# Patient Record
Sex: Male | Born: 1990 | Race: Black or African American | Hispanic: No | Marital: Single | Smoking: Never smoker
Health system: Southern US, Community
[De-identification: ages and names within clinical notes are randomized; demographics above are authoritative.]

## PROBLEM LIST (undated history)

## (undated) DIAGNOSIS — H33319 Horseshoe tear of retina without detachment, unspecified eye: Secondary | ICD-10-CM

---
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
## 2017-01-22 DIAGNOSIS — H5712 Ocular pain, left eye: Secondary | ICD-10-CM | POA: Diagnosis present

## 2017-01-22 DIAGNOSIS — H2000 Unspecified acute and subacute iridocyclitis: Secondary | ICD-10-CM | POA: Diagnosis not present

## 2017-01-23 ENCOUNTER — Emergency Department (HOSPITAL_COMMUNITY)
Admission: EM | Admit: 2017-01-23 | Discharge: 2017-01-23 | Disposition: A | Discharge status: Home / Self Care | Payer: PRIVATE HEALTH INSURANCE | Attending: Emergency Medicine | Admitting: Emergency Medicine

## 2017-01-23 ENCOUNTER — Encounter (HOSPITAL_COMMUNITY): Payer: Self-pay | Admitting: *Deleted

## 2017-01-23 DIAGNOSIS — H2000 Unspecified acute and subacute iridocyclitis: Secondary | ICD-10-CM

## 2017-01-23 HISTORY — DX: Horseshoe tear of retina without detachment, unspecified eye: H33.319

## 2017-01-23 MED ORDER — FLUORESCEIN SODIUM 0.6 MG OP STRP
1.0000 | ORAL_STRIP | Freq: Once | OPHTHALMIC | Status: AC
  Administered 2017-01-23: 1 via OPHTHALMIC

## 2017-01-23 MED ORDER — FLUORESCEIN SODIUM 0.6 MG OP STRP
ORAL_STRIP | OPHTHALMIC | Status: DC
Start: 2017-01-23 — End: 2017-01-23
  Filled 2017-01-23: qty 1

## 2017-01-23 MED ORDER — HOMATROPINE HBR 5 % OP SOLN
2.0000 [drp] | Freq: Once | OPHTHALMIC | Status: AC
Start: 2017-01-23 — End: 2017-01-23
  Administered 2017-01-23: 2 [drp] via OPHTHALMIC
  Filled 2017-01-23: qty 5

## 2017-01-23 MED ORDER — KETOROLAC TROMETHAMINE 0.5 % OP SOLN
1.0000 [drp] | Freq: Once | OPHTHALMIC | Status: AC
  Administered 2017-01-23: 1 [drp] via OPHTHALMIC
  Filled 2017-01-23: qty 5

## 2017-01-23 MED ORDER — TETRACAINE HCL 0.5 % OP SOLN
OPHTHALMIC | Status: AC
  Filled 2017-01-23: qty 4

## 2017-01-23 MED ORDER — TETRACAINE HCL 0.5 % OP SOLN
1.0000 [drp] | Freq: Once | OPHTHALMIC | Status: AC
  Administered 2017-01-23: 1 [drp] via OPHTHALMIC

## 2017-01-23 NOTE — ED Notes (Signed)
Patient unable to perform visual acuity test, patient states" I'm legally blind, and I don't have my contact in", could only read top letter.

## 2017-01-23 NOTE — ED Notes (Signed)
Pt refused all vital signs, RN explained the purpose of the vitals, pt continued to refuse to have vital signs taken,

## 2017-01-23 NOTE — Discharge Instructions (Signed)
Apply 1-2 drops of the homatropine drop in your left to keep your left eye dilated. You can apply this medicine daily, but the effect can last for several days, so only apply if your pain starts to return in your eye.  You may also apply the ketorolac drop which is a pain medicine for your eye - 1 drop every 6 hours.  Wear sunglasses to help with sun sensitivity and avoid rubbing your eye.  Do not wear your contacts until your eye doctor tells you it is safe to wear them.

## 2017-01-23 NOTE — ED Triage Notes (Signed)
Pt states that he has "holes" in his retina and was suppose to have surgery two years ago and did not have the surgery, pt states that he has "flare ups" from time to time and started having pain in left eye 3 hours ago,
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
## 2017-02-06 ENCOUNTER — Emergency Department (HOSPITAL_BASED_OUTPATIENT_CLINIC_OR_DEPARTMENT_OTHER)
Admission: EM | Admit: 2017-02-06 | Discharge: 2017-02-06 | Disposition: A | Discharge status: Home / Self Care | Payer: PRIVATE HEALTH INSURANCE | Attending: Emergency Medicine | Admitting: Emergency Medicine

## 2017-02-06 ENCOUNTER — Encounter (HOSPITAL_BASED_OUTPATIENT_CLINIC_OR_DEPARTMENT_OTHER): Payer: Self-pay | Admitting: Emergency Medicine

## 2017-02-06 ENCOUNTER — Emergency Department (HOSPITAL_BASED_OUTPATIENT_CLINIC_OR_DEPARTMENT_OTHER): Payer: PRIVATE HEALTH INSURANCE

## 2017-02-06 DIAGNOSIS — S0232XA Fracture of orbital floor, left side, initial encounter for closed fracture: Secondary | ICD-10-CM | POA: Diagnosis not present

## 2017-02-06 DIAGNOSIS — Y999 Unspecified external cause status: Secondary | ICD-10-CM | POA: Diagnosis not present

## 2017-02-06 DIAGNOSIS — Y929 Unspecified place or not applicable: Secondary | ICD-10-CM | POA: Insufficient documentation

## 2017-02-06 DIAGNOSIS — S02839A Fracture of medial orbital wall, unspecified side, initial encounter for closed fracture: Secondary | ICD-10-CM

## 2017-02-06 DIAGNOSIS — Y939 Activity, unspecified: Secondary | ICD-10-CM | POA: Diagnosis not present

## 2017-02-06 DIAGNOSIS — S0592XA Unspecified injury of left eye and orbit, initial encounter: Secondary | ICD-10-CM | POA: Diagnosis present

## 2017-02-06 DIAGNOSIS — S0230XB Fracture of orbital floor, unspecified side, initial encounter for open fracture: Secondary | ICD-10-CM

## 2017-02-06 MED ORDER — TETRACAINE HCL 0.5 % OP SOLN
1.0000 [drp] | Freq: Once | OPHTHALMIC | Status: AC
  Administered 2017-02-06: 2 [drp] via OPHTHALMIC
  Filled 2017-02-06: qty 4

## 2017-02-06 MED ORDER — MORPHINE SULFATE (PF) 4 MG/ML IV SOLN
4.0000 mg | Freq: Once | INTRAVENOUS | Status: AC
  Administered 2017-02-06: 4 mg via INTRAVENOUS
  Filled 2017-02-06: qty 1

## 2017-02-06 MED ORDER — FLUORESCEIN SODIUM 0.6 MG OP STRP
1.0000 | ORAL_STRIP | Freq: Once | OPHTHALMIC | Status: AC
  Administered 2017-02-06: 1 via OPHTHALMIC
  Filled 2017-02-06: qty 1

## 2017-02-06 NOTE — ED Notes (Signed)
Patient transported to CT 
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
## 2017-02-06 NOTE — ED Notes (Signed)
Officer Gillis at bedside.
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
## 2017-02-06 NOTE — ED Notes (Signed)
Pt is on his way to [facility] ER, pt has paperwork in a sealed envelope

## 2017-02-06 NOTE — ED Notes (Signed)
Officer Wynelle Beckmann present on unit.

## 2017-02-06 NOTE — ED Notes (Signed)
ED Provider at bedside. 
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
## 2018-10-05 ENCOUNTER — Other Ambulatory Visit: Payer: Self-pay

## 2018-10-05 ENCOUNTER — Emergency Department (HOSPITAL_COMMUNITY)
Admission: EM | Admit: 2018-10-05 | Discharge: 2018-10-05 | Disposition: A | Discharge status: Home / Self Care | Payer: PRIVATE HEALTH INSURANCE | Attending: Emergency Medicine | Admitting: Emergency Medicine

## 2018-10-05 ENCOUNTER — Encounter (HOSPITAL_COMMUNITY): Payer: Self-pay | Admitting: Emergency Medicine

## 2018-10-05 DIAGNOSIS — M79604 Pain in right leg: Secondary | ICD-10-CM | POA: Diagnosis present

## 2018-10-05 DIAGNOSIS — M5431 Sciatica, right side: Secondary | ICD-10-CM | POA: Insufficient documentation

## 2018-10-05 MED ORDER — KETOROLAC TROMETHAMINE 60 MG/2ML IM SOLN
60.0000 mg | Freq: Once | INTRAMUSCULAR | Status: AC
  Administered 2018-10-05: 60 mg via INTRAMUSCULAR
  Filled 2018-10-05: qty 2

## 2018-10-05 MED ORDER — METHYLPREDNISOLONE 4 MG PO TBPK: ORAL_TABLET | ORAL | 0 refills | Status: DC

## 2018-10-05 MED ORDER — DEXAMETHASONE SODIUM PHOSPHATE 10 MG/ML IJ SOLN
10.0000 mg | Freq: Once | INTRAMUSCULAR | Status: AC
  Administered 2018-10-05: 10 mg via INTRAMUSCULAR
  Filled 2018-10-05: qty 1

## 2018-10-05 MED ORDER — METHOCARBAMOL 500 MG PO TABS: 500.0000 mg | ORAL_TABLET | Freq: Three times a day (TID) | ORAL | 0 refills | Status: DC | PRN

## 2018-10-05 MED ORDER — IBUPROFEN 800 MG PO TABS: 800.0000 mg | ORAL_TABLET | Freq: Four times a day (QID) | ORAL | 0 refills | Status: DC | PRN

## 2018-10-05 NOTE — ED Provider Notes (Signed)
[facility] EMERGENCY DEPARTMENT Provider Note   CSN: [id number] Arrival date & time: 10/05/18  0604    History   Chief Complaint Chief Complaint  Patient presents with  . Leg Pain    HPI Phillip Vaughan is a 28 y.o. male.     Patient presents to the emergency department for evaluation of pain in the right leg.  Symptoms began yesterday.  He reports some progression over the course of the day.  He has not had any injury that he knows of.  Patient reports pain that starts at the right hip and buttock area and goes down the leg.  Pain goes down the outside of the right thigh and then around the outside and back part of the lower leg to the foot.  Reports that there is numbness associated with the pain.  Pain is better if he stands up, worsens if he sits down.  He has not noticed any weakness of the lower extremities.  He denies change in bowel or bladder function.     Past Medical History:  Diagnosis Date  . Tear of retina     Patient Active Problem List   Diagnosis Date Noted  . M C L SPRAIN 04/01/2009  . 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 No pertinent surgical history.      Home Medications    Prior to Admission medications   Medication Sig Start Date End Date Taking? Authorizing Provider  ibuprofen (ADVIL) 800 MG tablet Take 1 tablet (800 mg total) by mouth every 6 (six) hours as needed for moderate pain. 10/05/18   Gilda Crease, MD  methocarbamol (ROBAXIN) 500 MG tablet Take 1 tablet (500 mg total) by mouth every 8 (eight) hours as needed for muscle spasms. 10/05/18   Gilda Crease, MD  methylPREDNISolone (MEDROL DOSEPAK) 4 MG TBPK tablet As directed 10/05/18   , Canary Brim, MD    Family History No family history on file.  Social History Social History   Tobacco Use  . Smoking status: Never Smoker  . Smokeless tobacco: Never Used  Substance Use Topics  . Alcohol use: No  . Drug use: No      Allergies   Patient has no known allergies.   Review of Systems Review of Systems  Musculoskeletal:       Leg pain  Neurological: Positive for numbness.     Physical Exam Updated Vital Signs BP 133/74 (BP Location: Right Arm)   Pulse 73   Temp 97.7 F (36.5 C) (Oral)   Resp 18   Ht 5\' 10"  (1.778 m)   Wt 81.6 kg   SpO2 100%   BMI 25.83 kg/m   Physical Exam Vitals signs and nursing note reviewed.  Constitutional:      Appearance: Normal appearance.  HENT:     Head: Atraumatic.  Cardiovascular:     Pulses:          Dorsalis pedis pulses are 2+ on the right side.  Pulmonary:     Effort: Pulmonary effort is normal.  Musculoskeletal:     Lumbar back: He exhibits no tenderness.  Skin:    General: Skin is warm and dry.     Findings: No erythema or rash.  Neurological:     Mental Status: He is alert and oriented to person, place, and time.     Comments: Patient reports some subjective decreased sensation on the lateral aspect of the right lower leg.  There is some  reproduction of the pain with extension of the right leg at the knee.  Patient has normal strength including flexion extension at the hip, flexion extension at the knee and ankle.  No saddle anesthesia.  Psychiatric:        Mood and Affect: Mood normal.        Behavior: Behavior normal.      ED Treatments / Results  Labs (all labs ordered are listed, but only abnormal results are displayed) Labs Reviewed - No data to display  EKG None  Radiology No results found.  Procedures Procedures (including critical care time)  Medications Ordered in ED Medications  dexamethasone (DECADRON) injection 10 mg (has no administration in time range)  ketorolac (TORADOL) injection 60 mg (has no administration in time range)     Initial Impression / Assessment and Plan / ED Course  I have reviewed the triage vital signs and the nursing notes.  Pertinent labs & imaging results that were available during  my care of the patient were reviewed by me and considered in my medical decision making (see chart for details).        Patient presents to the ER with complaints of pain in the right leg that starts at the hip and radiates down the leg. Examination reveals SI tenderness without any associated concerning neurologic findings.  He does have some subjective decrease in sensation only on the lateral aspect of the calf area.  Patient's strength and reflexes were normal. There is no evidence of saddle anesthesia. Patient does not have a foot drop. Patient has not experienced any change in bowel or bladder function.  There is no calf tenderness or increased calf circumference or swelling.  No concern for DVT.  No overlying skin changes or signs for infection or any other rash.  Patient has normal palpable pulses.  As such, patient did not require any imaging or further studies. Patient was treated with analgesia.  Final Clinical Impressions(s) / ED Diagnoses   Final diagnoses:  Sciatica of right side    ED Discharge Orders         Ordered    methylPREDNISolone (MEDROL DOSEPAK) 4 MG TBPK tablet     10/05/18 0640    ibuprofen (ADVIL) 800 MG tablet  Every 6 hours PRN     10/05/18 0640    methocarbamol (ROBAXIN) 500 MG tablet  Every 8 hours PRN     10/05/18 0640           Gilda CreasePollina,  J, MD 10/05/18 [phone]

## 2018-10-05 NOTE — ED Triage Notes (Signed)
Pt C/O pain in the right sided lower leg and top of the right foot. Pt denies injury to the area.

## 2021-07-19 ENCOUNTER — Encounter: Payer: Self-pay | Admitting: Emergency Medicine

## 2021-07-19 ENCOUNTER — Ambulatory Visit
Admission: EM | Admit: 2021-07-19 | Discharge: 2021-07-19 | Disposition: A | Discharge status: Home / Self Care | Payer: Managed Care, Other (non HMO) | Attending: Urgent Care | Admitting: Urgent Care

## 2021-07-19 ENCOUNTER — Other Ambulatory Visit: Payer: Self-pay

## 2021-07-19 DIAGNOSIS — J069 Acute upper respiratory infection, unspecified: Secondary | ICD-10-CM | POA: Insufficient documentation

## 2021-07-19 DIAGNOSIS — R0982 Postnasal drip: Secondary | ICD-10-CM | POA: Diagnosis present

## 2021-07-19 DIAGNOSIS — R070 Pain in throat: Secondary | ICD-10-CM | POA: Insufficient documentation

## 2021-07-19 LAB — POCT RAPID STREP A (OFFICE): Rapid Strep A Screen: NEGATIVE

## 2021-07-19 MED ORDER — PSEUDOEPHEDRINE HCL 60 MG PO TABS: 60.0000 mg | ORAL_TABLET | Freq: Three times a day (TID) | ORAL | 0 refills | Status: DC | PRN

## 2021-07-19 MED ORDER — CETIRIZINE HCL 10 MG PO TABS: 10.0000 mg | ORAL_TABLET | Freq: Every day | ORAL | 0 refills | Status: AC

## 2021-07-19 MED ORDER — FLUTICASONE PROPIONATE 50 MCG/ACT NA SUSP: 2.0000 | Freq: Every day | NASAL | 12 refills | Status: DC

## 2021-07-19 NOTE — ED Provider Notes (Signed)
[facility]   MRN: [id number] DOB: 1990-08-18  Subjective:   Phillip Vaughan is a 31 y.o. male presenting for 3 day history of throat pain, painful swallowing, burning sensation of the throat.  Denies any sick contacts.  No runny or stuffy nose, ear pain, cough, chest pain, shortness of breath.  Patient is not a smoker.  Does not drink alcohol.  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  He feels like since he started this job she has noticed more changes with his sinuses and throat.  No current facility-administered medications for this encounter.  Current Outpatient Medications:    ibuprofen (ADVIL) 800 MG tablet, Take 1 tablet (800 mg total) by mouth every 6 (six) hours as needed for moderate pain., Disp: 20 tablet, Rfl: 0   methocarbamol (ROBAXIN) 500 MG tablet, Take 1 tablet (500 mg total) by mouth every 8 (eight) hours as needed for muscle spasms., Disp: 20 tablet, Rfl: 0   methylPREDNISolone (MEDROL DOSEPAK) 4 MG TBPK tablet, As directed, Disp: 21 tablet, Rfl: 0   No Known Allergies  Past Medical History:  Diagnosis Date   Tear of retina      History reviewed. No pertinent surgical history.  History reviewed. No pertinent family history.  Social History   Tobacco Use   Smoking status: Never   Smokeless tobacco: Never  Vaping Use   Vaping Use: Never used  Substance Use Topics   Alcohol use: No   Drug use: No    ROS   Objective:   Vitals: BP (!) 154/87 (BP Location: Right Arm)    Pulse 84    Temp 98 F (36.7 C) (Oral)    Resp 16    SpO2 97%   Physical Exam Constitutional:      General: He is not in acute distress.    Appearance: Normal appearance. He is well-developed and normal weight. He is not ill-appearing, toxic-appearing or diaphoretic.  HENT:     Head: Normocephalic and atraumatic.     Right Ear: External ear normal.     Left Ear: External ear normal.     Nose: Nose normal.      Mouth/Throat:     Pharynx: No pharyngeal swelling, oropharyngeal exudate, posterior oropharyngeal erythema or uvula swelling.     Tonsils: No tonsillar exudate or tonsillar abscesses. 0 on the right. 0 on the left.     Comments: Thick streaks of postnasal drainage overlying pharynx. Eyes:     General: No scleral icterus.       Right eye: No discharge.        Left eye: No discharge.     Extraocular Movements: Extraocular movements intact.  Cardiovascular:     Rate and Rhythm: Normal rate.  Pulmonary:     Effort: Pulmonary effort is normal.  Musculoskeletal:     Cervical back: Normal range of motion.  Neurological:     Mental Status: He is alert and oriented to person, place, and time.  Psychiatric:        Mood and Affect: Mood normal.        Behavior: Behavior normal.        Thought Content: Thought content normal.        Judgment: Judgment normal.    Results for orders placed or performed during the hospital encounter of 07/19/21 (from the past 24 hour(s))  POCT rapid strep A     Status:  None   Collection Time: 07/19/21  3:06 PM  Result Value Ref Range   Rapid Strep A Screen Negative Negative    Assessment and Plan :   PDMP not reviewed this encounter.  1. Viral upper respiratory infection   2. Throat pain   3. Post-nasal drainage    Strep culture pending.  Recommend supportive care for an acute viral pharyngitis.  Start triple therapy for suspected allergic rhinitis as well given exposure to chemicals and find concrete dust.  Use Flonase, Zyrtec daily and pseudoephedrine (prn).  Counseled patient on potential for adverse effects with medications prescribed/recommended today, ER and return-to-clinic precautions discussed, patient verbalized understanding.    Wallis Bamberg, [place] 07/19/21 1514

## 2021-07-19 NOTE — ED Triage Notes (Signed)
Burning sensation in throat since Friday.  Hurts to swallow.

## 2021-07-21 LAB — CULTURE, GROUP A STREP (THRC)

## 2022-07-01 ENCOUNTER — Other Ambulatory Visit: Payer: Self-pay

## 2022-07-01 ENCOUNTER — Encounter (HOSPITAL_COMMUNITY): Payer: Self-pay | Admitting: Emergency Medicine

## 2022-07-01 ENCOUNTER — Emergency Department (HOSPITAL_COMMUNITY)
Admission: EM | Admit: 2022-07-01 | Discharge: 2022-07-02 | Disposition: A | Discharge status: Home / Self Care | Payer: 59 | Attending: Emergency Medicine | Admitting: Emergency Medicine

## 2022-07-01 ENCOUNTER — Emergency Department (HOSPITAL_COMMUNITY): Payer: 59

## 2022-07-01 DIAGNOSIS — M6282 Rhabdomyolysis: Secondary | ICD-10-CM | POA: Diagnosis not present

## 2022-07-01 DIAGNOSIS — E860 Dehydration: Secondary | ICD-10-CM | POA: Diagnosis not present

## 2022-07-01 DIAGNOSIS — R0789 Other chest pain: Secondary | ICD-10-CM | POA: Diagnosis not present

## 2022-07-01 DIAGNOSIS — R079 Chest pain, unspecified: Secondary | ICD-10-CM | POA: Diagnosis not present

## 2022-07-01 LAB — CBC WITH DIFFERENTIAL/PLATELET
Abs Immature Granulocytes: 0.02 10*3/uL (ref 0.00–0.07)
Basophils Absolute: 0 10*3/uL (ref 0.0–0.1)
Basophils Relative: 0 %
Eosinophils Absolute: 0 10*3/uL (ref 0.0–0.5)
Eosinophils Relative: 0 %
HCT: 44.6 % (ref 39.0–52.0)
Hemoglobin: 16 g/dL (ref 13.0–17.0)
Immature Granulocytes: 0 %
Lymphocytes Relative: 8 %
Lymphs Abs: 0.8 10*3/uL (ref 0.7–4.0)
MCH: 31.7 pg (ref 26.0–34.0)
MCHC: 35.9 g/dL (ref 30.0–36.0)
MCV: 88.5 fL (ref 80.0–100.0)
Monocytes Absolute: 0.6 10*3/uL (ref 0.1–1.0)
Monocytes Relative: 6 %
Neutro Abs: 8.5 10*3/uL — ABNORMAL HIGH (ref 1.7–7.7)
Neutrophils Relative %: 86 %
Platelets: 220 10*3/uL (ref 150–400)
RBC: 5.04 MIL/uL (ref 4.22–5.81)
RDW: 12.6 % (ref 11.5–15.5)
WBC: 9.9 10*3/uL (ref 4.0–10.5)
nRBC: 0 % (ref 0.0–0.2)

## 2022-07-01 LAB — COMPREHENSIVE METABOLIC PANEL
ALT: 33 U/L (ref 0–44)
AST: 37 U/L (ref 15–41)
Albumin: 4.2 g/dL (ref 3.5–5.0)
Alkaline Phosphatase: 48 U/L (ref 38–126)
Anion gap: 12 (ref 5–15)
BUN: 19 mg/dL (ref 6–20)
CO2: 22 mmol/L (ref 22–32)
Calcium: 9.2 mg/dL (ref 8.9–10.3)
Chloride: 99 mmol/L (ref 98–111)
Creatinine, Ser: 1.16 mg/dL (ref 0.61–1.24)
GFR, Estimated: >60 mL/min (ref >60)
Glucose, Bld: 94 mg/dL (ref 70–99)
Potassium: 3.4 mmol/L — ABNORMAL LOW (ref 3.5–5.1)
Sodium: 133 mmol/L — ABNORMAL LOW (ref 135–145)
Total Bilirubin: 0.5 mg/dL (ref 0.3–1.2)
Total Protein: 7.3 g/dL (ref 6.5–8.1)

## 2022-07-01 LAB — URINALYSIS, ROUTINE W REFLEX MICROSCOPIC
Bilirubin Urine: NEGATIVE
Glucose, UA: NEGATIVE mg/dL
Hgb urine dipstick: NEGATIVE
Ketones, ur: 5 mg/dL — AB
Leukocytes,Ua: NEGATIVE
Nitrite: NEGATIVE
Protein, ur: NEGATIVE mg/dL
Specific Gravity, Urine: 1.026 (ref 1.005–1.030)
pH: 5 (ref 5.0–8.0)

## 2022-07-01 LAB — CK: Total CK: 544 U/L — ABNORMAL HIGH (ref 49–397)

## 2022-07-01 LAB — D-DIMER, QUANTITATIVE: D-Dimer, Quant: 0.37 ug/mL-FEU (ref 0.00–0.50)

## 2022-07-01 LAB — TROPONIN I (HIGH SENSITIVITY): Troponin I (High Sensitivity): 10 ng/L (ref <18)

## 2022-07-01 LAB — MAGNESIUM: Magnesium: 1.5 mg/dL — ABNORMAL LOW (ref 1.7–2.4)

## 2022-07-01 MED ORDER — LACTATED RINGERS IV BOLUS
1000.0000 mL | Freq: Once | INTRAVENOUS | Status: AC
  Administered 2022-07-01: 1000 mL via INTRAVENOUS

## 2022-07-01 MED ORDER — SODIUM CHLORIDE 0.9 % IV BOLUS
1000.0000 mL | Freq: Once | INTRAVENOUS | Status: AC
  Administered 2022-07-01: 1000 mL via INTRAVENOUS

## 2022-07-01 NOTE — ED Provider Triage Note (Signed)
Emergency Medicine Provider Triage Evaluation Note  Phillip Vaughan , a 32 y.o. male  was evaluated in triage.  Pt complains of presents the ER for evaluation of chest pain that is been central and pressure-like sensation since 0900 today.  Patient reports that happened while he was running but continued during his run.  He also ports that he has not urinated since after his run this morning.  He takes a protein supplement.  He reports he has had 7 bottles of water but has not urinated at all.  Review of Systems  Positive:  Negative:   Physical Exam  BP 126/80   Pulse 93   Temp 98.7 F (37.1 C) (Oral)   Resp 18   Ht 5\' 10"  (1.778 m)   Wt 94.3 kg   SpO2 97%   BMI 29.84 kg/m  Gen:   Awake, no distress   Resp:  Normal effort  MSK:   Moves extremities without difficulty  Other:  Some diffuse chest pain upon palpation. Slight tachycardia. Lungs are CTAB.  Medical Decision Making  Medically screening exam initiated at 9:29 PM.  Appropriate orders placed.  Phillip Vaughan was informed that the remainder of the evaluation will be completed by another provider, this initial triage assessment does not replace that evaluation, and the importance of remaining in the ED until their evaluation is complete.  EKG was brought to attending attached to this chart. She asked for me to place consult to oncall STEMI physician. 9:32 PM consult made.  9:35 PM I spoke with Dr. Susanne Greenhouse who reviewed the EKG.  He thinks this is likely early polarization otherwise healthy male.  Does recommend still collecting labs and further working this up.    Sherrell Puller, [place] 07/01/22 2136

## 2022-07-01 NOTE — ED Triage Notes (Addendum)
Pt c/o left sided chest pressure that radiates to back starting at approx 9am when pt was running. Pt states he also has not urinated since approx 8am this morning despite drinking 12 bottles of water. Pt states he took a water restricting supplement this morning which he has taken before without this problem.

## 2022-07-01 NOTE — ED Provider Notes (Signed)
[facility] Emergency Department Provider Note MRN:  [id number]  Arrival date & time: 07/01/22     Chief Complaint   Chest Pain and Urinary Retention   History of Present Illness   Phillip Vaughan is a 32 y.o. year-old male with no pertinent past medical history presenting to the ED with chief complaint of chest pain.  Patient took some of his brothers preworkout pills this morning and then went on a run and then lifted weights.  He experienced some chest pressure during the run which has since resolved.  He felt very fatigued after the workout and had to lay around all day drinking water.  He became concerned that he did not urinate all day, here for evaluation.  Review of Systems  A thorough review of systems was obtained and all systems are negative except as noted in the HPI and PMH.   Patient's Health History    Past Medical History:  Diagnosis Date   Tear of retina     History reviewed. No pertinent surgical history.  History reviewed. No pertinent family history.  Social History   Socioeconomic History   Marital status: Single    Spouse name: Not on file   Number of children: Not on file   Years of education: Not on file   Highest education level: Not on file  Occupational History   Not on file  Tobacco Use   Smoking status: Never   Smokeless tobacco: Never  Vaping Use   Vaping Use: Never used  Substance and Sexual Activity   Alcohol use: No   Drug use: No   Sexual activity: Not on file  Other Topics Concern   Not on file  Social History Narrative   Not on file   Social Determinants of Health   Financial Resource Strain: Not on file  Food Insecurity: Not on file  Transportation Needs: Not on file  Physical Activity: Not on file  Stress: Not on file  Social Connections: Not on file  Intimate Partner Violence: Not on file     Physical Exam   Vitals:   07/01/22 2245 07/01/22 2300  BP: 124/67 (!) 109/51  Pulse: 76 69  Resp:  13 12  Temp:    SpO2: 98% 98%    CONSTITUTIONAL: Well-appearing, NAD NEURO/PSYCH:  Alert and oriented x 3, no focal deficits EYES:  eyes equal and reactive ENT/NECK:  no LAD, no JVD CARDIO: Regular rate, well-perfused, normal S1 and S2 PULM:  CTAB no wheezing or rhonchi GI/GU:  non-distended, non-tender MSK/SPINE:  No gross deformities, no edema SKIN:  no rash, atraumatic   *Additional and/or pertinent findings included in MDM below  Diagnostic and Interventional Summary    EKG Interpretation  Date/Time:  Thursday July 01 2022 21:24:21 EST Ventricular Rate:  87 PR Interval:  150 QRS Duration: 80 QT Interval:  344 QTC Calculation: 413 R Axis:   -34 Text Interpretation: Normal sinus rhythm with sinus arrhythmia Left axis deviation Possible Inferior infarct , age undetermined Abnormal ECG No previous ECGs available Confirmed by Gerlene Fee [phone]) on 07/01/2022 11:30:52 PM       Labs Reviewed  CK - Abnormal; Notable for the following components:      Result Value   Total CK 544 (*)    All other components within normal limits  MAGNESIUM - Abnormal; Notable for the following components:   Magnesium 1.5 (*)    All other components within normal limits  CBC WITH DIFFERENTIAL/PLATELET - Abnormal;  Notable for the following components:   Neutro Abs 8.5 (*)    All other components within normal limits  COMPREHENSIVE METABOLIC PANEL - Abnormal; Notable for the following components:   Sodium 133 (*)    Potassium 3.4 (*)    All other components within normal limits  URINALYSIS, ROUTINE W REFLEX MICROSCOPIC - Abnormal; Notable for the following components:   Color, Urine AMBER (*)    APPearance CLOUDY (*)    Ketones, ur 5 (*)    All other components within normal limits  D-DIMER, QUANTITATIVE  TROPONIN I (HIGH SENSITIVITY)  TROPONIN I (HIGH SENSITIVITY)    DG Chest 2 View  Final Result      Medications  sodium chloride 0.9 % bolus 1,000 mL (has no administration in  time range)  lactated ringers bolus 1,000 mL (0 mLs Intravenous Stopped 07/01/22 2321)     Procedures  /  Critical Care Procedures  ED Course and Medical Decision Making  Initial Impression and Ddx Suspicious for dehydration, possibly rhabdomyolysis given the history.  He is feeling a lot better, still with some low blood pressure on my assessment.  He is now making urine.  Not having any further chest pain.  EKG showing some nonspecific findings, no prior for comparison.  Past medical/surgical history that increases complexity of ED encounter: None  Interpretation of Diagnostics I personally reviewed the EKG and my interpretation is as follows: Sinus rhythm, likely benign early repolarization  Labs reassuring with no significant blood count or electrolyte disturbance, troponin negative x 2.  CK is mildly elevated  Patient Reassessment and Ultimate Disposition/Management     Patient continues to look and feel well, after second liter of fluids blood pressure is reassuring, appropriate for discharge with continued hydration at home.  Advised to avoid preworkout in the future.  Patient management required discussion with the following services or consulting groups:  None  Complexity of Problems Addressed Acute illness or injury that poses threat of life of bodily function  Additional Data Reviewed and Analyzed Further history obtained from: Further history from spouse/family member  Additional Factors Impacting ED Encounter Risk None  Barth Kirks. Sedonia Small, MD [address] [email or web address]  Final Clinical Impressions(s) / ED Diagnoses     ICD-10-CM   1. Dehydration  E86.0     2. Non-traumatic rhabdomyolysis  M62.82       ED Discharge Orders     None        Discharge Instructions Discussed with and Provided to Patient:   Discharge Instructions   None      Maudie Flakes, MD 07/01/22 2332

## 2022-07-02 LAB — TROPONIN I (HIGH SENSITIVITY): Troponin I (High Sensitivity): 9 ng/L (ref <18)

## 2022-07-02 NOTE — ED Notes (Signed)
AVS reviewed with pt and family prior to discharge. Pt verbalizes understanding. Belongings with pt upon depart. Ambulatory to POV with family.  

## 2022-07-02 NOTE — Discharge Instructions (Signed)
You were evaluated in the Emergency Department and after careful evaluation, we did not find any emergent condition requiring admission or further testing in the hospital.  Your exam/testing today is overall reassuring.  Symptoms likely due to dehydration and some mild rhabdomyolysis.  Important that you continue to be hydrated at home.  With the exception of protein powder recommend avoiding any preworkout supplements.  Please return to the Emergency Department if you experience any worsening of your condition.   Thank you for allowing [place] to be a part of your care.

## 2023-10-28 DIAGNOSIS — H5213 Myopia, bilateral: Secondary | ICD-10-CM | POA: Diagnosis not present

## 2024-01-20 ENCOUNTER — Ambulatory Visit
Admission: EM | Admit: 2024-01-20 | Discharge: 2024-01-20 | Disposition: A | Discharge status: Home / Self Care | Payer: No Typology Code available for payment source | Attending: Family Medicine | Admitting: Family Medicine

## 2024-01-20 ENCOUNTER — Ambulatory Visit: Payer: Self-pay

## 2024-01-20 DIAGNOSIS — K047 Periapical abscess without sinus: Secondary | ICD-10-CM | POA: Diagnosis not present

## 2024-01-20 MED ORDER — LIDOCAINE VISCOUS HCL 2 % MT SOLN: 10.0000 mL | OROMUCOSAL | 0 refills | Status: AC | PRN

## 2024-01-20 MED ORDER — AMOXICILLIN-POT CLAVULANATE 875-125 MG PO TABS: 1.0000 | ORAL_TABLET | Freq: Two times a day (BID) | ORAL | 0 refills | Status: AC

## 2024-01-20 MED ORDER — CHLORHEXIDINE GLUCONATE 0.12 % MT SOLN: 15.0000 mL | Freq: Two times a day (BID) | OROMUCOSAL | 0 refills | Status: AC

## 2024-01-20 NOTE — ED Provider Notes (Signed)
 [facility]    CSN: [id number] Arrival date & time: 01/20/24  1658      History   Chief Complaint No chief complaint on file.   HPI Phillip Vaughan is a 33 y.o. male.   Patient presenting today with several day history of progressively worsening dental pain from known dental issues on the left upper jaw and right lower jaw.  He states he has this episodically for the past year but has been unbearable the past day or so.  Denies bleeding, drainage, fever, chills, difficulty breathing or swallowing.  So far trying over-the-counter pain relievers with minimal relief.    Past Medical History:  Diagnosis Date   Tear of retina     Patient Active Problem List   Diagnosis Date Noted   M C L SPRAIN 04/01/2009   Closed dislocation of acromioclavicular joint 04/10/2008    History reviewed. No pertinent surgical history.     Home Medications    Prior to Admission medications   Medication Sig Start Date End Date Taking? Authorizing Provider  amoxicillin -clavulanate (AUGMENTIN ) 875-125 MG tablet Take 1 tablet by mouth every 12 (twelve) hours. 01/20/24  Yes Stuart Vernell Norris, PA-C  chlorhexidine  (PERIDEX ) 0.12 % solution Use as directed 15 mLs in the mouth or throat 2 (two) times daily. 01/20/24  Yes Stuart Vernell Norris, PA-C  lidocaine  (XYLOCAINE ) 2 % solution Use as directed 10 mLs in the mouth or throat every 3 (three) hours as needed. 01/20/24  Yes Stuart Vernell Norris, PA-C  cetirizine  (ZYRTEC  ALLERGY) 10 MG tablet Take 1 tablet (10 mg total) by mouth daily. Patient not taking: Reported on 07/01/2022 07/19/21   Christopher Savannah, PA-C  fluticasone  (FLONASE ) 50 MCG/ACT nasal spray Place 2 sprays into both nostrils daily. Patient not taking: Reported on 07/01/2022 07/19/21   Christopher Savannah, PA-C  ibuprofen  (ADVIL ) 800 MG tablet Take 1 tablet (800 mg total) by mouth every 6 (six) hours as needed for moderate pain. Patient not taking: Reported on 07/01/2022 10/05/18   Haze Lonni PARAS, MD  methocarbamol  (ROBAXIN ) 500 MG tablet Take 1 tablet (500 mg total) by mouth every 8 (eight) hours as needed for muscle spasms. Patient not taking: Reported on 07/01/2022 10/05/18   Haze Lonni PARAS, MD  methylPREDNISolone  (MEDROL  DOSEPAK) 4 MG TBPK tablet As directed Patient not taking: Reported on 07/01/2022 10/05/18   Haze Lonni PARAS, MD  pseudoephedrine  (SUDAFED) 60 MG tablet Take 1 tablet (60 mg total) by mouth every 8 (eight) hours as needed for congestion. Patient not taking: Reported on 07/01/2022 07/19/21   Christopher Savannah, PA-C    Family History History reviewed. No pertinent family history.  Social History Social History   Tobacco Use   Smoking status: Never   Smokeless tobacco: Never  Vaping Use   Vaping status: Never Used  Substance Use Topics   Alcohol use: No   Drug use: No     Allergies   Patient has no known allergies.   Review of Systems Review of Systems Per HPI  Physical Exam Triage Vital Signs ED Triage Vitals  Encounter Vitals Group     BP 01/20/24 1720 (!) 149/95     Girls Systolic BP Percentile --      Girls Diastolic BP Percentile --      Boys Systolic BP Percentile --      Boys Diastolic BP Percentile --      Pulse Rate 01/20/24 1720 72     Resp 01/20/24 1720 18  Temp 01/20/24 1720 97.8 F (36.6 C)     Temp Source 01/20/24 1720 Oral     SpO2 01/20/24 1720 97 %     Weight --      Height --      Head Circumference --      Peak Flow --      Pain Score 01/20/24 1724 9     Pain Loc --      Pain Education --      Exclude from Growth Chart --    No data found.  Updated Vital Signs BP (!) 149/95 (BP Location: Right Arm)   Pulse 72   Temp 97.8 F (36.6 C) (Oral)   Resp 18   SpO2 97%   Visual Acuity Right Eye Distance:   Left Eye Distance:   Bilateral Distance:    Right Eye Near:   Left Eye Near:    Bilateral Near:     Physical Exam Vitals and nursing note reviewed.  Constitutional:      Appearance:  Normal appearance.  HENT:     Head: Atraumatic.     Mouth/Throat:     Comments: Decaying molars to the left upper and right lower with gingival erythema Eyes:     Extraocular Movements: Extraocular movements intact.     Conjunctiva/sclera: Conjunctivae normal.  Cardiovascular:     Rate and Rhythm: Normal rate.  Pulmonary:     Effort: Pulmonary effort is normal.  Musculoskeletal:        General: Normal range of motion.     Cervical back: Normal range of motion and neck supple.  Skin:    General: Skin is warm and dry.  Neurological:     General: No focal deficit present.     Mental Status: He is oriented to person, place, and time.  Psychiatric:        Mood and Affect: Mood normal.        Thought Content: Thought content normal.        Judgment: Judgment normal.      UC Treatments / Results  Labs (all labs ordered are listed, but only abnormal results are displayed) Labs Reviewed - No data to display  EKG   Radiology No results found.  Procedures Procedures (including critical care time)  Medications Ordered in UC Medications - No data to display  Initial Impression / Assessment and Plan / UC Course  I have reviewed the triage vital signs and the nursing notes.  Pertinent labs & imaging results that were available during my care of the patient were reviewed by me and considered in my medical decision making (see chart for details).     Treat with Augmentin , viscous lidocaine , Peridex  rinse, supportive over-the-counter medications and home care.  Return for worsening symptoms.  Final Clinical Impressions(s) / UC Diagnoses   Final diagnoses:  Dental infection   Discharge Instructions   None    ED Prescriptions     Medication Sig Dispense Auth. Provider   amoxicillin -clavulanate (AUGMENTIN ) 875-125 MG tablet Take 1 tablet by mouth every 12 (twelve) hours. 14 tablet Stuart Vernell Norris, PA-C   lidocaine  (XYLOCAINE ) 2 % solution Use as directed 10 mLs in  the mouth or throat every 3 (three) hours as needed. 100 mL Stuart Vernell Norris, PA-C   chlorhexidine  (PERIDEX ) 0.12 % solution Use as directed 15 mLs in the mouth or throat 2 (two) times daily. 120 mL Stuart Vernell Norris, [place]      PDMP not reviewed this  encounter.   Stuart Vernell Norris, [place] 01/20/24 [id number]

## 2024-01-20 NOTE — Telephone Encounter (Signed)
 FYI Only or Action Required?: Action required by provider: request for appointment.  Patient was last seen in primary care on .  Called Nurse Triage reporting Dental Pain. Pain 10/10.  Symptoms began several weeks ago.  Interventions attempted: Nothing.  Symptoms are: gradually worsening. Pt. Does not have a dentist or PCP. Asking for a dentist. Instructed to go to ED/UC, verbalizes understanding.  Triage Disposition: See Dentist Within 24 Hours  Patient/caregiver understands and will follow disposition?: Yes   Copied from CRM #[id number]. Topic: Clinical - Red Word Triage >> Jan 20, 2024  2:07 PM Elle L wrote: Red Word that prompted transfer to Nurse Triage: The patient is not established at a Practice but called [facility] as he states the number was on his insurance card and he is currently in severe nerve pain from a dental issue. Reason for Disposition  [1] Face swelling AND [2] no fever  Answer Assessment - Initial Assessment Questions 1. LOCATION: Which tooth is hurting?  (e.g., right-side/left-side, upper/lower, front/back)     Upper left 2. ONSET: When did the toothache start?  (e.g., hours, days)      weeks 3. SEVERITY: How bad is the toothache?  (Scale 1-10; mild, moderate or severe)     severe 4. SWELLING: Is there any visible swelling of your face?     yes 5. OTHER SYMPTOMS: Do you have any other symptoms? (e.g., fever)     no 6. PREGNANCY: Is there any chance you are pregnant? When was your last menstrual period?     N/a  Protocols used: Toothache-A-AH

## 2024-01-20 NOTE — ED Triage Notes (Addendum)
 Pt reports dental pain on both sides of his mouth states he has been using tylenol and ibuprofen , and topical lidocaine  but has found no relief. Pain has been ongoing about a year intermittently the last few days has been consistent.

## 2024-01-21 ENCOUNTER — Emergency Department (HOSPITAL_COMMUNITY)
Admission: EM | Admit: 2024-01-21 | Discharge: 2024-01-21 | Disposition: A | Discharge status: Home / Self Care | Attending: Emergency Medicine | Admitting: Emergency Medicine

## 2024-01-21 ENCOUNTER — Other Ambulatory Visit: Payer: Self-pay

## 2024-01-21 ENCOUNTER — Encounter (HOSPITAL_COMMUNITY): Payer: Self-pay

## 2024-01-21 DIAGNOSIS — K0889 Other specified disorders of teeth and supporting structures: Secondary | ICD-10-CM | POA: Diagnosis not present

## 2024-01-21 MED ORDER — HYDROCODONE-ACETAMINOPHEN 5-325 MG PO TABS: 1.0000 | ORAL_TABLET | Freq: Four times a day (QID) | ORAL | 0 refills | Status: AC | PRN

## 2024-01-21 MED ORDER — IBUPROFEN 400 MG PO TABS
400.0000 mg | ORAL_TABLET | Freq: Once | ORAL | Status: AC
  Administered 2024-01-21: 400 mg via ORAL
  Filled 2024-01-21: qty 1

## 2024-01-21 NOTE — Discharge Instructions (Addendum)
 Stop taking Tylenol  You can take ibuprofen  400 mg (2 over the counter tablets) every 8 hours for the next 3 days You can take the Vicodin that I have prescribed every 6 hours as needed and do not take with Tylenol  Call the dentist Dr. Elsie Bihari on Monday

## 2024-01-21 NOTE — ED Notes (Signed)
 ED Provider at bedside.

## 2024-01-21 NOTE — ED Provider Notes (Signed)
 [name] Provider Note   CSN: [id number] Arrival date & time: 01/21/24  9861     Patient presents with: Dental Pain   Phillip Vaughan is a 33 y.o. male.   The history is provided by the patient.  Patient presents for continued dental pain. Patient was just seen at the urgent care yesterday for worsening dental pain mostly in the left upper jaw.  He was placed on Augmentin  and oral rinses but the pain is continuing.  He reports over the past 2 days he has taken multiple doses of ibuprofen  and acetaminophen  but no relief He reports mild swelling to his face.  No fevers or vomiting.  No tongue or throat swelling.  No visual changes. He reports since he is on Medicaid he is unable to get into see a dentist   Past Medical History:  Diagnosis Date   Tear of retina     Prior to Admission medications   Medication Sig Start Date End Date Taking? Authorizing Provider  HYDROcodone -acetaminophen  (NORCO/VICODIN) 5-325 MG tablet Take 1 tablet by mouth every 6 (six) hours as needed. 01/21/24  Yes Midge Golas, MD  amoxicillin -clavulanate (AUGMENTIN ) 875-125 MG tablet Take 1 tablet by mouth every 12 (twelve) hours. 01/20/24   Stuart Vernell Norris, PA-C  cetirizine  (ZYRTEC  ALLERGY) 10 MG tablet Take 1 tablet (10 mg total) by mouth daily. Patient not taking: Reported on 07/01/2022 07/19/21   Christopher Savannah, PA-C  chlorhexidine  (PERIDEX ) 0.12 % solution Use as directed 15 mLs in the mouth or throat 2 (two) times daily. 01/20/24   Stuart Vernell Norris, PA-C  lidocaine  (XYLOCAINE ) 2 % solution Use as directed 10 mLs in the mouth or throat every 3 (three) hours as needed. 01/20/24   Stuart Vernell Norris, PA-C    Allergies: Patient has no known allergies.    Review of Systems  Updated Vital Signs BP 133/89   Pulse 80   Temp 97.9 F (36.6 C)   Resp 18   Ht 1.778 m (5' 10)   Wt 94.3 kg   SpO2 100%   BMI 29.83 kg/m   Physical Exam CONSTITUTIONAL:  Well developed/well nourished HEAD: Normocephalic/atraumatic EYES: EOMI/PERRL, no proptosis, no icterus ENMT: Mucous membranes moist, minimal swelling noted to the left maxilla, no crepitus, no erythema or fluctuance Uvula midline no erythema or exudates, no stridor, no drooling.  No signs of Ludwig angina Poor dentition noted to the left upper molars with diffuse tenderness but no fluctuant abscess No dysphonia is noted NECK: supple no meningeal signs NEURO: Pt is awake/alert/appropriate, moves all extremitiesx4.  No facial droop.    (all labs ordered are listed, but only abnormal results are displayed) Labs Reviewed - No data to display  EKG: None  Radiology: No results found.   Procedures   Medications Ordered in the ED - No data to display                                  Medical Decision Making Risk Prescription drug management.   Presents for continued dental pain.  He just started Augmentin  and oral rinses Patient does have  dental tenderness on exam, but no signs of dental abscess.  No signs of Ludwig angina, patient is not septic appearing.  He is resting comfortably  Will add on short course of hydrocodone -acetaminophen   I discussed at length appropriate use of OTC meds.  He was instructed  to stop Tylenol , and given instructions on appropriate use of the prescription Vicodin as well as OTC ibuprofen  Given referral to dentistry     Final diagnoses:  Dentalgia    ED Discharge Orders          Ordered    HYDROcodone -acetaminophen  (NORCO/VICODIN) 5-325 MG tablet  Every 6 hours PRN        01/21/24 0324               Midge Golas, MD 01/21/24 [phone]

## 2024-01-21 NOTE — ED Triage Notes (Signed)
 Pov from home. Cc of dental pain. Went to UC earlier rec'd some scripts but does not have any relief.
